# Patient Record
Sex: Female | Born: 1960 | Race: Black or African American | Hispanic: No | Marital: Single | Smoking: Current every day smoker
Health system: Southern US, Community
[De-identification: ages and names within clinical notes are randomized; demographics above are authoritative.]

## PROBLEM LIST (undated history)

## (undated) DIAGNOSIS — I10 Essential (primary) hypertension: Secondary | ICD-10-CM

## (undated) DIAGNOSIS — E119 Type 2 diabetes mellitus without complications: Secondary | ICD-10-CM

## (undated) DIAGNOSIS — M1990 Unspecified osteoarthritis, unspecified site: Secondary | ICD-10-CM

---

## 2017-11-10 ENCOUNTER — Emergency Department (HOSPITAL_COMMUNITY)
Admission: EM | Admit: 2017-11-10 | Discharge: 2017-11-11 | Disposition: A | Discharge status: Home / Self Care | Payer: Medicare Other | Attending: Emergency Medicine | Admitting: Emergency Medicine

## 2017-11-10 ENCOUNTER — Emergency Department (HOSPITAL_COMMUNITY): Payer: Medicare Other

## 2017-11-10 ENCOUNTER — Encounter (HOSPITAL_COMMUNITY): Payer: Self-pay | Admitting: Emergency Medicine

## 2017-11-10 DIAGNOSIS — L97309 Non-pressure chronic ulcer of unspecified ankle with unspecified severity: Secondary | ICD-10-CM

## 2017-11-10 DIAGNOSIS — L89529 Pressure ulcer of left ankle, unspecified stage: Secondary | ICD-10-CM | POA: Diagnosis present

## 2017-11-10 DIAGNOSIS — E11621 Type 2 diabetes mellitus with foot ulcer: Secondary | ICD-10-CM | POA: Diagnosis not present

## 2017-11-10 DIAGNOSIS — I10 Essential (primary) hypertension: Secondary | ICD-10-CM | POA: Diagnosis not present

## 2017-11-10 DIAGNOSIS — E11622 Type 2 diabetes mellitus with other skin ulcer: Secondary | ICD-10-CM

## 2017-11-10 DIAGNOSIS — L97529 Non-pressure chronic ulcer of other part of left foot with unspecified severity: Secondary | ICD-10-CM | POA: Insufficient documentation

## 2017-11-10 DIAGNOSIS — F17200 Nicotine dependence, unspecified, uncomplicated: Secondary | ICD-10-CM | POA: Insufficient documentation

## 2017-11-10 HISTORY — DX: Unspecified osteoarthritis, unspecified site: M19.90

## 2017-11-10 HISTORY — DX: Essential (primary) hypertension: I10

## 2017-11-10 HISTORY — DX: Type 2 diabetes mellitus without complications: E11.9

## 2017-11-10 LAB — BASIC METABOLIC PANEL
Anion gap: 8 (ref 5–15)
BUN: 14 mg/dL (ref 6–20)
CO2: 27 mmol/L (ref 22–32)
Calcium: 9.8 mg/dL (ref 8.9–10.3)
Chloride: 105 mmol/L (ref 101–111)
Creatinine, Ser: 0.75 mg/dL (ref 0.44–1.00)
GFR calc Af Amer: >60 mL/min (ref >60)
GFR calc non Af Amer: >60 mL/min (ref >60)
Glucose, Bld: 340 mg/dL — ABNORMAL HIGH (ref 65–99)
Potassium: 3.7 mmol/L (ref 3.5–5.1)
Sodium: 140 mmol/L (ref 135–145)

## 2017-11-10 LAB — CBC
HCT: 34 % — ABNORMAL LOW (ref 36.0–46.0)
Hemoglobin: 10.7 g/dL — ABNORMAL LOW (ref 12.0–15.0)
MCH: 22.4 pg — ABNORMAL LOW (ref 26.0–34.0)
MCHC: 31.5 g/dL (ref 30.0–36.0)
MCV: 71.3 fL — ABNORMAL LOW (ref 78.0–100.0)
Platelets: 438 10*3/uL — ABNORMAL HIGH (ref 150–400)
RBC: 4.77 MIL/uL (ref 3.87–5.11)
RDW: 14.3 % (ref 11.5–15.5)
WBC: 11.8 10*3/uL — ABNORMAL HIGH (ref 4.0–10.5)

## 2017-11-10 NOTE — ED Triage Notes (Signed)
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 Approx size of baseball, drainage noted.

## 2017-11-11 DIAGNOSIS — L97529 Non-pressure chronic ulcer of other part of left foot with unspecified severity: Secondary | ICD-10-CM | POA: Diagnosis not present

## 2017-11-11 LAB — CBG MONITORING, ED: Glucose-Capillary: 258 mg/dL — ABNORMAL HIGH (ref 65–99)

## 2017-11-11 MED ORDER — CEPHALEXIN 500 MG PO CAPS: 500.0000 mg | ORAL_CAPSULE | Freq: Four times a day (QID) | ORAL | 0 refills | Status: AC

## 2017-11-11 MED ORDER — DOXYCYCLINE HYCLATE 100 MG PO CAPS: 100.0000 mg | ORAL_CAPSULE | Freq: Two times a day (BID) | ORAL | 0 refills | Status: AC

## 2017-11-11 NOTE — ED Notes (Signed)
Pt son Arlys John requesting that he be called when she gets a room [phone]

## 2017-11-11 NOTE — ED Notes (Signed)
Pt refusing gown. Hooked up to monitors.

## 2017-11-11 NOTE — Discharge Instructions (Addendum)
Medications: Doxycycline, Keflex  Treatment: Take doxycycline and Keflex as prescribed until completed.  Follow-up: We recommended that you stay in the hospital today. Please see Dr. Alvy Bimler at your scheduled appointment on June 4.  Please return the emergency department if your symptoms worsen before that time, including increasing pain, redness, swelling, drainage, red streaking from the area, or fever.

## 2017-11-11 NOTE — ED Notes (Signed)
Patient is expressing her wishes to leave and go home by stating "get me my damn doctor. I hate hospitals!" Dr. Madilyn Hook has been made aware.

## 2017-11-11 NOTE — Discharge Planning (Signed)
[name] J. Lucretia Roers, RN, BSN, [place] [phone]  [facility] set up appointment with Kristine Hogan JOSE   on 6/4 @ 11:00.  Spoke with pt at bedside and advised to please arrive 15 min early and take a picture ID and your current medications.  Pt verbalizes understanding of keeping appointment.

## 2017-11-11 NOTE — ED Notes (Signed)
Pt.refuse to have vitalsigns done

## 2017-11-11 NOTE — ED Provider Notes (Signed)
[facility] EMERGENCY DEPARTMENT Provider Note   CSN: [id number] Arrival date & time: 11/10/17  2016     History   Chief Complaint Chief Complaint  Patient presents with  . Diabetic Ulcer    HPI Kristine Hogan is a 57 y.o. female with history of hypertension, diabetes, legal blindness, who presents with a reported 1 week history of ulcer to left lateral ankle.  She has had some intermittent pain and itching to the area.  No drainage.  She denies any fevers, chest pain, shortness of breath, abdominal pain, nausea, vomiting, urinary symptoms.  Patient has not taken any interventions at home.  She has been taking her medications, although she just recently moved here from [place] and has not establish care with a PCP yet.  HPI  Past Medical History:  Diagnosis Date  . Arthritis   . Diabetes mellitus without complication (HCC)   . Hypertension     There are no active problems to display for this patient.   Past Surgical History:  Procedure Laterality Date  . CESAREAN SECTION       OB History   None      Home Medications    Prior to Admission medications   Medication Sig Start Date End Date Taking? Authorizing Provider  cephALEXin (KEFLEX) 500 MG capsule Take 1 capsule (500 mg total) by mouth 4 (four) times daily. 11/11/17   Kloey Cazarez, Waylan Boga, PA-C  doxycycline (VIBRAMYCIN) 100 MG capsule Take 1 capsule (100 mg total) by mouth 2 (two) times daily. 11/11/17   Emi Holes, PA-C    Family History No family history on file.  Social History Social History   Tobacco Use  . Smoking status: Current Every Day Smoker    Packs/day: 0.50  . Smokeless tobacco: Never Used  Substance Use Topics  . Alcohol use: Not Currently  . Drug use: Not Currently     Allergies   Aspirin   Review of Systems Review of Systems  Constitutional: Negative for chills and fever.  HENT: Negative for facial swelling and sore throat.   Respiratory: Negative for  shortness of breath.   Cardiovascular: Negative for chest pain.  Gastrointestinal: Negative for abdominal pain, nausea and vomiting.  Genitourinary: Negative for dysuria.  Musculoskeletal: Negative for back pain.  Skin: Positive for wound. Negative for rash.  Neurological: Negative for headaches.  Psychiatric/Behavioral: The patient is not nervous/anxious.      Physical Exam Updated Vital Signs BP 138/76   Pulse 79   Temp 98.9 F (37.2 C) (Oral)   Resp 18   Ht  (1.626 m)   Wt 54.4 kg (120 lb)   SpO2 100%   BMI 20.60 kg/m   Physical Exam  Constitutional: She appears well-developed and well-nourished. No distress.  HENT:  Head: Normocephalic and atraumatic.  Mouth/Throat: Oropharynx is clear and moist. No oropharyngeal exudate.  Eyes: Pupils are equal, round, and reactive to light. Conjunctivae are normal. Right eye exhibits no discharge. Left eye exhibits no discharge. No scleral icterus.  Neck: Normal range of motion. Neck supple. No thyromegaly present.  Cardiovascular: Normal rate, regular rhythm, normal heart sounds and intact distal pulses. Exam reveals no gallop and no friction rub.  No murmur heard. Pulmonary/Chest: Effort normal and breath sounds normal. No stridor. No respiratory distress. She has no wheezes. She has no rales.  Abdominal: Soft. Bowel sounds are normal. She exhibits no distension. There is no tenderness. There is no rebound and no guarding.  Musculoskeletal:  She exhibits no edema.  Lymphadenopathy:    She has no cervical adenopathy.  Neurological: She is alert. Coordination normal.  Skin: Skin is warm and dry. No rash noted. She is not diaphoretic. No pallor.  Large, chronic appearing ulceration to left lateral ankle, mild erythema on the inferior aspect, no drainage, mild tenderness  Psychiatric: She has a normal mood and affect.  Nursing note and vitals reviewed.      ED Treatments / Results  Labs (all labs ordered are listed, but only  abnormal results are displayed) Labs Reviewed  CBC - Abnormal; Notable for the following components:      Result Value   WBC 11.8 (*)    Hemoglobin 10.7 (*)    HCT 34.0 (*)    MCV 71.3 (*)    MCH 22.4 (*)    Platelets 438 (*)    All other components within normal limits  BASIC METABOLIC PANEL - Abnormal; Notable for the following components:   Glucose, Bld 340 (*)    All other components within normal limits  CBG MONITORING, ED - Abnormal; Notable for the following components:   Glucose-Capillary 258 (*)    All other components within normal limits    EKG None  Radiology Dg Ankle Complete Left  Result Date: 11/10/2017 CLINICAL DATA:  Ulcer left ankle.  Diabetes. EXAM: LEFT ANKLE COMPLETE - 3+ VIEW COMPARISON:  None. FINDINGS: Soft tissue swelling, particularly laterally. The ulcer is apparently located laterally. No underlying bony erosion. No acute fracture. IMPRESSION: Soft tissue swelling and ulceration in the soft tissues over the lateral malleolus. No evidence of osteomyelitis with no bony erosion identified. Electronically Signed   By: Gerome Sam III M.D   On: 11/10/2017 21:23    Procedures Procedures (including critical care time)  Medications Ordered in ED Medications - No data to display   Initial Impression / Assessment and Plan / ED Course  I have reviewed the triage vital signs and the nursing notes.  Pertinent labs & imaging results that were available during my care of the patient were reviewed by me and considered in my medical decision making (see chart for details).     Patient with diabetic ulcer to left lateral ankle.  CBC shows WBC 11.8, hemoglobin 10.7.  BMP shows glucose 340, reduced to 258 in the ED.  My attending, Dr. Madilyn Hook, advised patient be admitted, however patient is adamant that she wants to go home.  Using shared decision-making, we will discharge home with Keflex and doxycycline with follow-up to PCP.  Case management made an appointment  for the patient in 5 days.  Strict return precautions given.  Patient understands and agrees with plan.  Patient vitals stable throughout ED course and discharged in satisfactory condition.  Patient also evaluated by Dr. Madilyn Hook who guided the patient's management and agrees with plan.  Final Clinical Impressions(s) / ED Diagnoses   Final diagnoses:  Diabetic ulcer of ankle [facility])    ED Discharge Orders        Ordered    cephALEXin (KEFLEX) 500 MG capsule  4 times daily     11/11/17 1052    doxycycline (VIBRAMYCIN) 100 MG capsule  2 times daily     11/11/17 [address], [place], [place] 11/11/17 1113    Tilden Fossa, MD 11/12/17 1026

## 2017-11-11 NOTE — Discharge Planning (Signed)
Spoke with pt at bedside regarding follow-up appointment.  Pt aware of time and date of appointment.  Pt asked if she can prepare for discharge (put shoes and socks on) as she is ready to go.  EDCM confirmed with RN that pt should prepare for discharge as they are awaiting AVS.

## 2017-11-17 ENCOUNTER — Ambulatory Visit: Payer: Self-pay | Admitting: Emergency Medicine

## 2019-10-16 IMAGING — DX DG ANKLE COMPLETE 3+V*L*
3 series · 3 of 3 positions shown · non-contrast
Comparison: None.

CLINICAL DATA: Ulcer left ankle.  Diabetes.

EXAM:
LEFT ANKLE COMPLETE - 3+ VIEW

[x ankle ap left]
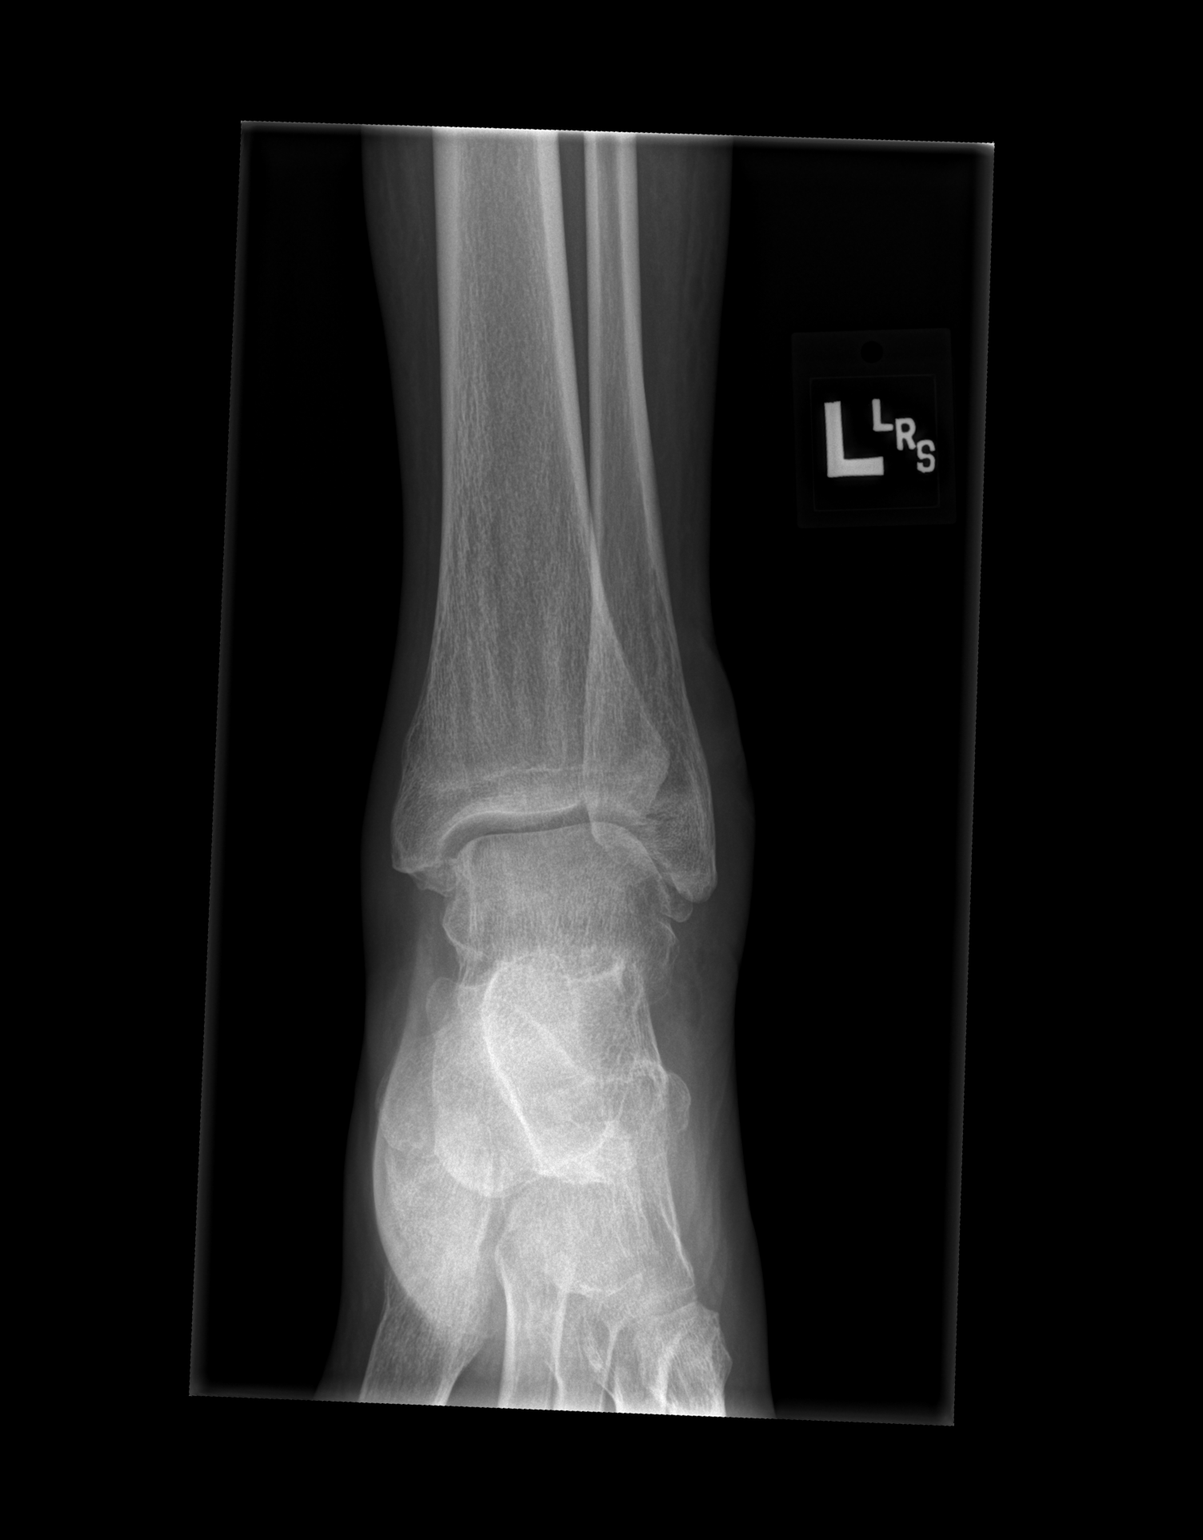

[x ankle obl left]
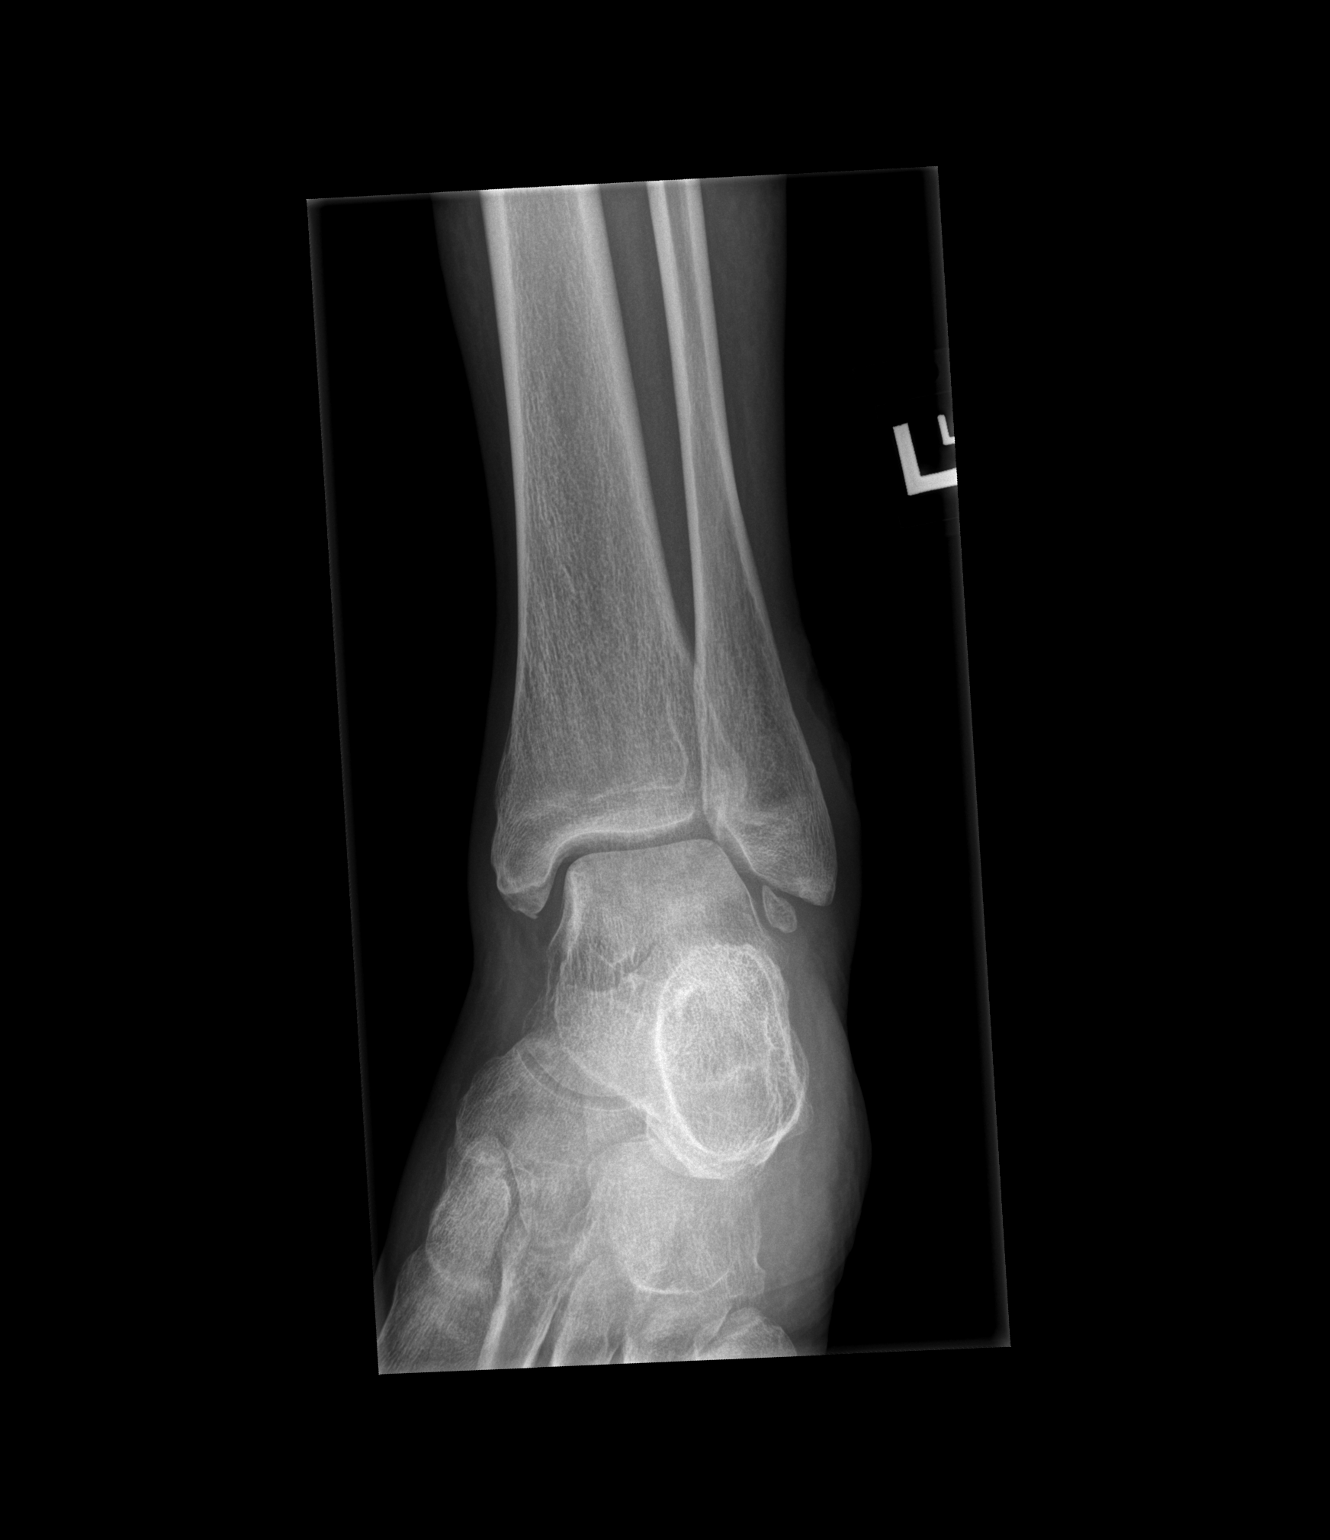

[x ankle lat left]
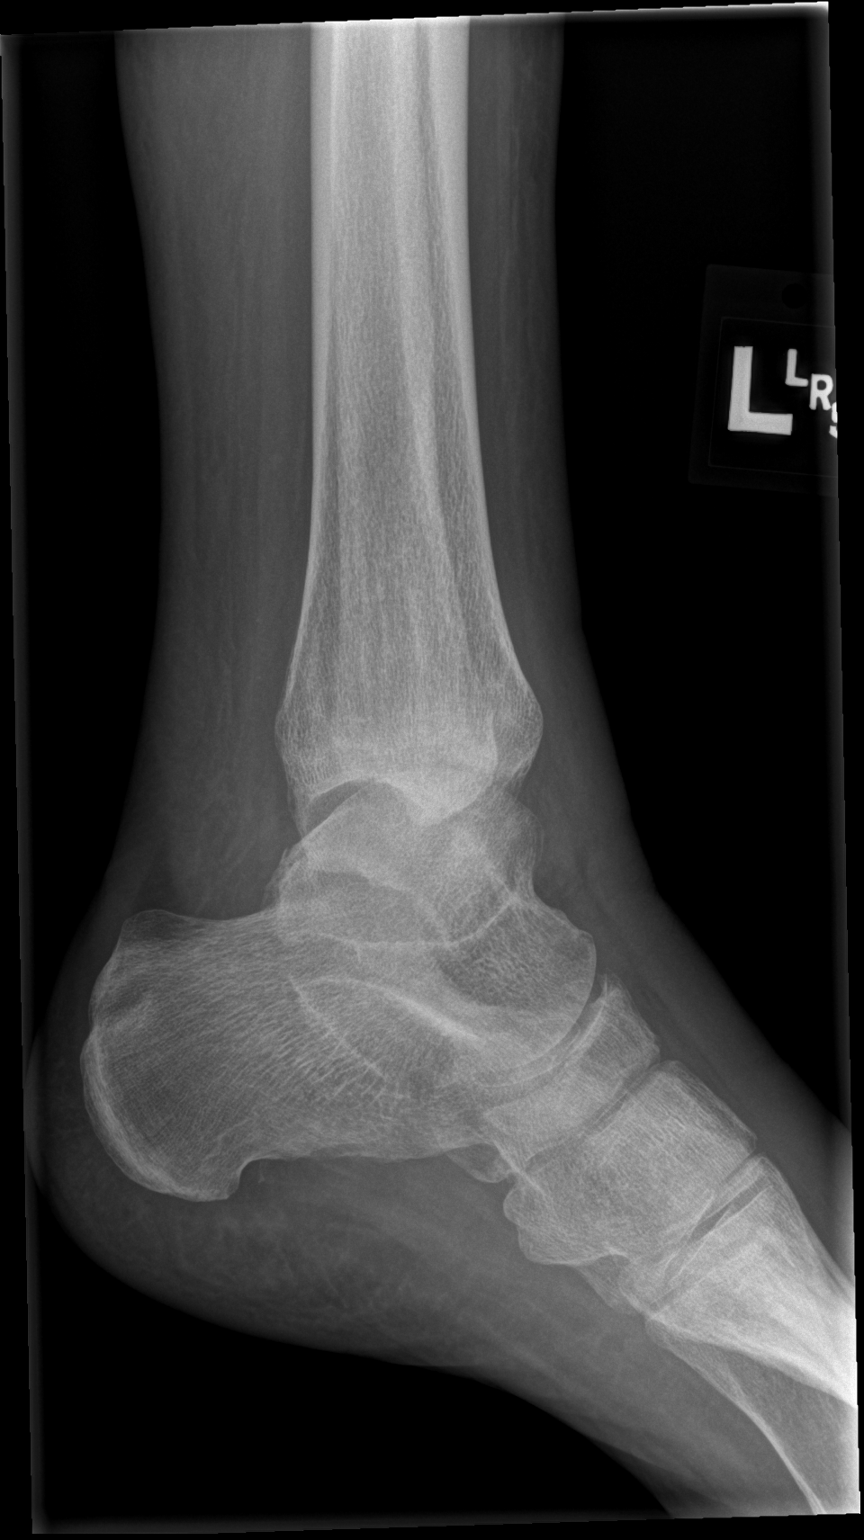

[3 of 3 positions shown; findings below may reference images not displayed]

FINDINGS: Soft tissue swelling, particularly laterally. The ulcer is
apparently located laterally. No underlying bony erosion. No acute
fracture.
IMPRESSION: Soft tissue swelling and ulceration in the soft tissues over the
lateral malleolus. No evidence of osteomyelitis with no bony erosion
identified.
